# Patient Record
Sex: Male | Born: 2005 | Race: White | Hispanic: No | Marital: Single | Smoking: Never smoker
Health system: Southern US, Community
[De-identification: ages and names within clinical notes are randomized; demographics above are authoritative.]

## PROBLEM LIST (undated history)

## (undated) DIAGNOSIS — D446 Neoplasm of uncertain behavior of carotid body: Secondary | ICD-10-CM

## (undated) DIAGNOSIS — G902 Horner's syndrome: Secondary | ICD-10-CM

## (undated) DIAGNOSIS — K5900 Constipation, unspecified: Secondary | ICD-10-CM

## (undated) HISTORY — PX: ADENOIDECTOMY: SUR15

## (undated) HISTORY — PX: MYRINGOTOMY: SUR874

## (undated) HISTORY — PX: TONSILLECTOMY: SUR1361

## (undated) HISTORY — DX: Constipation, unspecified: K59.00

---
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
## 2023-02-28 ENCOUNTER — Emergency Department (HOSPITAL_COMMUNITY): Payer: BC Managed Care – PPO

## 2023-02-28 ENCOUNTER — Encounter (HOSPITAL_COMMUNITY): Payer: Self-pay | Admitting: Emergency Medicine

## 2023-02-28 ENCOUNTER — Other Ambulatory Visit: Payer: Self-pay

## 2023-02-28 ENCOUNTER — Emergency Department (HOSPITAL_COMMUNITY)
Admission: EM | Admit: 2023-02-28 | Discharge: 2023-02-28 | Disposition: A | Discharge status: Home / Self Care | Payer: BC Managed Care – PPO | Source: Home / Self Care | Attending: Emergency Medicine | Admitting: Emergency Medicine

## 2023-02-28 DIAGNOSIS — R1084 Generalized abdominal pain: Secondary | ICD-10-CM | POA: Diagnosis present

## 2023-02-28 LAB — COMPREHENSIVE METABOLIC PANEL
ALT: 17 U/L (ref 0–44)
AST: 17 U/L (ref 15–41)
Albumin: 4.3 g/dL (ref 3.5–5.0)
Alkaline Phosphatase: 47 U/L (ref 38–126)
Anion gap: 10 (ref 5–15)
BUN: 15 mg/dL (ref 6–20)
CO2: 25 mmol/L (ref 22–32)
Calcium: 9.2 mg/dL (ref 8.9–10.3)
Chloride: 104 mmol/L (ref 98–111)
Creatinine, Ser: 0.92 mg/dL (ref 0.61–1.24)
GFR, Estimated: >60 mL/min (ref >60)
Glucose, Bld: 83 mg/dL (ref 70–99)
Potassium: 3.9 mmol/L (ref 3.5–5.1)
Sodium: 139 mmol/L (ref 135–145)
Total Bilirubin: 1.4 mg/dL — ABNORMAL HIGH (ref 0.3–1.2)
Total Protein: 7 g/dL (ref 6.5–8.1)

## 2023-02-28 LAB — CBC
HCT: 43.7 % (ref 39.0–52.0)
Hemoglobin: 15.2 g/dL (ref 13.0–17.0)
MCH: 28.7 pg (ref 26.0–34.0)
MCHC: 34.8 g/dL (ref 30.0–36.0)
MCV: 82.6 fL (ref 80.0–100.0)
Platelets: 209 10*3/uL (ref 150–400)
RBC: 5.29 MIL/uL (ref 4.22–5.81)
RDW: 12.7 % (ref 11.5–15.5)
WBC: 4.1 10*3/uL (ref 4.0–10.5)
nRBC: 0 % (ref 0.0–0.2)

## 2023-02-28 LAB — LIPASE, BLOOD: Lipase: 24 U/L (ref 11–51)

## 2023-02-28 MED ORDER — IOHEXOL 300 MG/ML  SOLN
100.0000 mL | Freq: Once | INTRAMUSCULAR | Status: AC | PRN
  Administered 2023-02-28: 100 mL via INTRAVENOUS

## 2023-02-28 MED ORDER — ONDANSETRON 4 MG PO TBDP: 4.0000 mg | ORAL_TABLET | Freq: Three times a day (TID) | ORAL | 0 refills | Status: AC | PRN

## 2023-02-28 MED ORDER — NAPROXEN 500 MG PO TABS: 500.0000 mg | ORAL_TABLET | Freq: Two times a day (BID) | ORAL | 0 refills | Status: AC

## 2023-02-28 NOTE — Discharge Instructions (Signed)
You were seen today in the emergency department for abdominal pain. Labs and imaging came back within normal limits.   You were sent home with a prescription for Zofran for nausea, take as needed.  If you continue to have abdominal pain you can alternate Tylenol and ibuprofen as needed for pain control.  Return to the ED if you pain continues or worsens. Follow up with your PCP in 2-3 days.

## 2023-02-28 NOTE — ED Triage Notes (Signed)
Pt returned from [place] Monday and has been having lower abd pain worse with movement.  Pt states had diarrhea Monday and Tuesday and had not had another BM until a small amount this morning that he brought to the ER with him. Pt denies vomiting and denies fever

## 2023-02-28 NOTE — ED Provider Triage Note (Signed)
Emergency Medicine Provider Triage Evaluation Note  Chad Cummings , a 17 y.o. male  was evaluated in triage.  Pt complains of abdominal pain diarrhea.  Review of Systems  Positive: Mid epigastric and lower abdominal pain Negative: Dysuria  Physical Exam  BP 109/64 (BP Location: Right Arm)   Pulse 83   Temp 99.1 F (37.3 C) (Oral)   Resp 18   Ht 6\' 1"  (1.854 m)   Wt 80.7 kg   SpO2 100%   BMI 23.48 kg/m  Gen:   Awake, no distress   Resp:  Normal effort  MSK:   Moves extremities without difficulty  Other:  Abdomen soft, mild midepigastric and lower abdominal pain  Medical Decision Making  Medically screening exam initiated at 1:19 PM.  Appropriate orders placed.  Snehal Binks was informed that the remainder of the evaluation will be completed by another provider, this initial triage assessment does not replace that evaluation, and the importance of remaining in the ED until their evaluation is complete.     Terrilee Files, MD 02/28/23 [phone]

## 2023-02-28 NOTE — ED Provider Notes (Signed)
EMERGENCY DEPARTMENT AT [facility] Provider Note   CSN: [id number] Arrival date & time: 02/28/23  1153     History  Chief Complaint  Patient presents with   Abdominal Pain    Chad Cummings is a 17 y.o. male abd pain and change in BM. Pt returned from [place] on Monday. Reports having diarrhea Mon and Tue, then did not have a BM until today. He started having abdominal pain Thursday that was episodic and random. Reports pain is not worse with movement. Pain is generalized to lower abdomen, cramping and sharp pain lasting several minutes. Pain is 10/10 when he is having pain, but right now pt is reporting 0/10. Pt tried taking milk of mg and another laxative yesterday, but did not have a BM. Pt was seen by PCP this morning who was concerned about appendicitis and sent him here to be seen.    Abdominal Pain Associated symptoms: constipation   Associated symptoms: no chest pain, no diarrhea, no dysuria, no fatigue, no fever, no hematuria, no nausea and no shortness of breath        Home Medications Prior to Admission medications   Medication Sig Start Date End Date Taking? Authorizing Provider  famotidine (PEPCID) 40 MG tablet Take 40 mg by mouth daily. 07/04/20   [provider]  fluticasone (FLONASE) 50 MCG/ACT nasal spray Place 2 sprays into the nose daily as needed. For allergies    [provider]  levocetirizine (XYZAL) 5 MG tablet SMARTSIG:1 Tablet(s) By Mouth Every Evening 06/13/20   [provider]  montelukast (SINGULAIR) 5 MG chewable tablet Chew 5 mg by mouth at bedtime.    [provider]  sertraline (ZOLOFT) 50 MG tablet Take 1 tablet (50 mg total) by mouth daily. 05/14/22   Paretta-Leahey, Miachel Roux, NP  triamcinolone ointment (KENALOG) 0.1 %  07/07/16   [provider]      Allergies    Polymyxin b and Amoxicillin    Review of Systems   Review of Systems  Constitutional:  Negative for fatigue and fever.   Respiratory:  Negative for chest tightness, shortness of breath and wheezing.   Cardiovascular:  Negative for chest pain and leg swelling.  Gastrointestinal:  Positive for abdominal pain and constipation. Negative for abdominal distention, blood in stool, diarrhea and nausea.  Genitourinary:  Negative for dysuria, frequency and hematuria.  Neurological:  Negative for dizziness, syncope and weakness.    Physical Exam Updated Vital Signs BP 109/64 (BP Location: Right Arm)   Pulse 83   Temp 99.1 F (37.3 C) (Oral)   Resp 18   Ht 6\' 1"  (1.854 m)   Wt 80.7 kg   SpO2 100%   BMI 23.48 kg/m  Physical Exam Vitals and nursing note reviewed.  Constitutional:      General: He is not in acute distress.    Appearance: He is not toxic-appearing.  Cardiovascular:     Rate and Rhythm: Normal rate and regular rhythm.     Pulses: Normal pulses.     Heart sounds: Normal heart sounds.  Pulmonary:     Effort: Pulmonary effort is normal. No respiratory distress.     Breath sounds: Normal breath sounds.  Abdominal:     General: Abdomen is flat. Bowel sounds are normal. There is no distension.     Palpations: Abdomen is soft.     Tenderness: There is no abdominal tenderness.  Skin:    General: Skin is warm and dry.  Findings: No lesion.  Neurological:     General: No focal deficit present.     Mental Status: He is alert and oriented to person, place, and time. Mental status is at baseline.     ED Results / Procedures / Treatments   Labs (all labs ordered are listed, but only abnormal results are displayed) Labs Reviewed  COMPREHENSIVE METABOLIC PANEL - Abnormal; Notable for the following components:      Result Value   Total Bilirubin 1.4 (*)    All other components within normal limits  LIPASE, BLOOD  CBC  URINALYSIS, ROUTINE W REFLEX MICROSCOPIC    EKG None  Radiology CT ABDOMEN PELVIS W CONTRAST  Result Date: 02/28/2023 CLINICAL DATA:  RIGHT lower quadrant pain. EXAM: CT  ABDOMEN AND PELVIS WITH CONTRAST TECHNIQUE: Multidetector CT imaging of the abdomen and pelvis was performed using the standard protocol following bolus administration of intravenous contrast. RADIATION DOSE REDUCTION: This exam was performed according to the departmental dose-optimization program which includes automated exposure control, adjustment of the mA and/or kV according to patient size and/or use of iterative reconstruction technique. CONTRAST:  OMNIPAQUE IOHEXOL 300 MG/ML  SOLN COMPARISON:  None Available. FINDINGS: Lower chest: Lung bases are clear. Hepatobiliary: No focal hepatic lesion. Normal gallbladder. No biliary duct dilatation. Common bile duct is normal. Pancreas: Pancreas is normal. No ductal dilatation. No pancreatic inflammation. Spleen: Normal spleen Adrenals/urinary tract: Adrenal glands and kidneys are normal. The ureters and bladder normal. Stomach/Bowel: Stomach, small bowel, appendix, and cecum are normal. The colon and rectosigmoid colon are normal. Vascular/Lymphatic: Abdominal aorta is normal caliber. No periportal or retroperitoneal adenopathy. No pelvic adenopathy. Reproductive: Prostate normal Other: No free fluid. Musculoskeletal: No aggressive osseous lesion. IMPRESSION: 1. Normal appendix. 2. No bowel inflammation or obstruction. 3. No renal stone disease. Electronically Signed   By: Genevive Bi M.D.   On: 02/28/2023 15:24    Procedures Procedures    Medications Ordered in ED Medications  iohexol (OMNIPAQUE) 300 MG/ML solution 100 mL (100 mLs Intravenous Contrast Given 02/28/23 1459)    ED Course/ Medical Decision Making/ A&P                                 Medical Decision Making Amount and/or Complexity of Data Reviewed Labs: ordered.  Risk Prescription drug management.   This patient presents to the ED for concern of abdominal pain after a period of diarrhea followed by constipation, this involves an extensive number of treatment options, and  is a complaint that carries with it a high risk of complications and morbidity.  The differential diagnosis includes appendicitis, gastroenteritis, diarrhea, constipation    Co morbidities that complicate the patient evaluation  Pt reports no prior medical history    Additional history obtained:  Additional history obtained from mother at bedside w/ pt    Lab Tests:  I Ordered, and personally interpreted labs.  The pertinent results include:   Cbc, cmp and lipase unremarkable    Imaging Studies ordered:  I ordered imaging studies including CT abd/pelvis w/ contrast   I independently visualized and interpreted imaging which showed unremarkable, no acute appendicitis was visualized  I agree with the radiologist interpretation   Cardiac Monitoring: / EKG:  Not obtained    Consultations Obtained:  Consult was not obtained for pt    Problem List / ED Course / Critical interventions / Medication management  This pt is reporting with  abd pain and change in BM. His vitals were stable throughout visit today. Reports primary concern abdominal pain.   Pain is generalized to lower abdomen, cramping and sharp pain lasting several minutes.Pt was seen by PCP this morning who was concerned about appendicitis and sent him here to be seen.  I ordered medication for management of nausea  Reevaluation of the patient after these medicines showed that the patient stayed the same I have reviewed the patients home medicines and have made adjustments as needed     Plan is to send patient home with Zofran for nausea and OTC pain control for abdominal pain. Appendicitis was ruled out w/ CT abd/pelvis. Pt encouraged to discontinue laxities and let diarrhea and constipation resolve on its own. If symptoms continued or worsen encouraged to return to ED.        Final Clinical Impression(s) / ED Diagnoses Final diagnoses:  None    Rx / DC Orders ED Discharge Orders     None          Raford Pitcher Evalee Jefferson 02/28/23 1856    Eber Hong, MD 03/01/23 [phone]

## 2024-07-28 ENCOUNTER — Emergency Department (HOSPITAL_COMMUNITY): Admission: EM | Admit: 2024-07-28 | Discharge: 2024-07-29 | Disposition: A | Discharge status: Home / Self Care

## 2024-07-28 ENCOUNTER — Encounter (HOSPITAL_COMMUNITY): Payer: Self-pay

## 2024-07-28 ENCOUNTER — Other Ambulatory Visit: Payer: Self-pay

## 2024-07-28 DIAGNOSIS — K529 Noninfective gastroenteritis and colitis, unspecified: Secondary | ICD-10-CM | POA: Insufficient documentation

## 2024-07-28 DIAGNOSIS — E860 Dehydration: Secondary | ICD-10-CM | POA: Diagnosis not present

## 2024-07-28 DIAGNOSIS — D72829 Elevated white blood cell count, unspecified: Secondary | ICD-10-CM | POA: Diagnosis not present

## 2024-07-28 DIAGNOSIS — R197 Diarrhea, unspecified: Secondary | ICD-10-CM | POA: Diagnosis present

## 2024-07-28 LAB — CBC
HCT: 47 % (ref 39.0–52.0)
Hemoglobin: 17 g/dL (ref 13.0–17.0)
MCH: 29.2 pg (ref 26.0–34.0)
MCHC: 36.2 g/dL — ABNORMAL HIGH (ref 30.0–36.0)
MCV: 80.6 fL (ref 80.0–100.0)
Platelets: 233 K/uL (ref 150–400)
RBC: 5.83 MIL/uL — ABNORMAL HIGH (ref 4.22–5.81)
RDW: 12.5 % (ref 11.5–15.5)
WBC: 11.5 K/uL — ABNORMAL HIGH (ref 4.0–10.5)
nRBC: 0 % (ref 0.0–0.2)

## 2024-07-28 LAB — RESP PANEL BY RT-PCR (RSV, FLU A&B, COVID)  RVPGX2
Influenza A by PCR: NEGATIVE
Influenza B by PCR: NEGATIVE
Resp Syncytial Virus by PCR: NEGATIVE
SARS Coronavirus 2 by RT PCR: NEGATIVE

## 2024-07-28 LAB — COMPREHENSIVE METABOLIC PANEL WITH GFR
ALT: 14 U/L (ref 0–44)
AST: 19 U/L (ref 15–41)
Albumin: 5.2 g/dL — ABNORMAL HIGH (ref 3.5–5.0)
Alkaline Phosphatase: 52 U/L (ref 38–126)
Anion gap: 14 (ref 5–15)
BUN: 17 mg/dL (ref 6–20)
CO2: 25 mmol/L (ref 22–32)
Calcium: 9.9 mg/dL (ref 8.9–10.3)
Chloride: 101 mmol/L (ref 98–111)
Creatinine, Ser: 0.87 mg/dL (ref 0.61–1.24)
GFR, Estimated: >60 mL/min
Glucose, Bld: 125 mg/dL — ABNORMAL HIGH (ref 70–99)
Potassium: 4.1 mmol/L (ref 3.5–5.1)
Sodium: 140 mmol/L (ref 135–145)
Total Bilirubin: 1.6 mg/dL — ABNORMAL HIGH (ref 0.0–1.2)
Total Protein: 7.4 g/dL (ref 6.5–8.1)

## 2024-07-28 LAB — SARS CORONAVIRUS 2 BY RT PCR: SARS Coronavirus 2 by RT PCR: NEGATIVE

## 2024-07-28 LAB — LIPASE, BLOOD: Lipase: 20 U/L (ref 11–51)

## 2024-07-28 NOTE — ED Triage Notes (Signed)
 Pt reports headache, vomiting, diarrhea since this morning.

## 2024-07-29 LAB — URINALYSIS, ROUTINE W REFLEX MICROSCOPIC
Bacteria, UA: NONE SEEN
Bilirubin Urine: NEGATIVE
Glucose, UA: NEGATIVE mg/dL
Hgb urine dipstick: NEGATIVE
Ketones, ur: 20 mg/dL — AB
Leukocytes,Ua: NEGATIVE
Nitrite: NEGATIVE
Protein, ur: 30 mg/dL — AB
Specific Gravity, Urine: 1.038 — ABNORMAL HIGH (ref 1.005–1.030)
pH: 5 (ref 5.0–8.0)

## 2024-07-29 MED ORDER — ONDANSETRON HCL 4 MG PO TABS: 4.0000 mg | ORAL_TABLET | Freq: Three times a day (TID) | ORAL | 0 refills | Status: AC | PRN

## 2024-07-29 MED ORDER — LACTATED RINGERS IV BOLUS
1000.0000 mL | Freq: Once | INTRAVENOUS | Status: AC
  Administered 2024-07-29: 1000 mL via INTRAVENOUS

## 2024-07-29 MED ORDER — ONDANSETRON HCL 4 MG/2ML IJ SOLN
4.0000 mg | Freq: Once | INTRAMUSCULAR | Status: AC
  Administered 2024-07-29: 4 mg via INTRAVENOUS
  Filled 2024-07-29: qty 2

## 2024-07-29 MED ORDER — KETOROLAC TROMETHAMINE 15 MG/ML IJ SOLN
15.0000 mg | Freq: Once | INTRAMUSCULAR | Status: AC
  Administered 2024-07-29: 15 mg via INTRAVENOUS
  Filled 2024-07-29: qty 1

## 2024-07-29 NOTE — ED Provider Notes (Signed)
 " [place] EMERGENCY DEPARTMENT AT [facility] Provider Note   CSN: [id number] Arrival date & time: 07/28/24  1929     Patient presents with: Emesis, Diarrhea, and Headache   Chad Cummings is a 19 y.o. male.  {Add pertinent medical, surgical, social history, OB history to HPI:2124} 19 year old male presents for evaluation of diarrhea, vomiting and headache that started earlier today.  States has been unable to keep anything down.  States he has not vomited in 3 hours.  He denies any other symptoms or concerns.   Emesis Associated symptoms: diarrhea and headaches   Associated symptoms: no abdominal pain, no arthralgias, no chills, no cough, no fever and no sore throat   Diarrhea Associated symptoms: headaches and vomiting   Associated symptoms: no abdominal pain, no arthralgias, no chills and no fever   Headache Associated symptoms: diarrhea, nausea and vomiting   Associated symptoms: no abdominal pain, no back pain, no cough, no ear pain, no eye pain, no fever, no seizures and no sore throat        Prior to Admission medications  Medication Sig Start Date End Date Taking? Authorizing Provider  famotidine (PEPCID) 40 MG tablet Take 40 mg by mouth daily. 07/04/20   [provider]  fluticasone (FLONASE) 50 MCG/ACT nasal spray Place 2 sprays into the nose daily as needed. For allergies    [provider]  levocetirizine (XYZAL) 5 MG tablet SMARTSIG:1 Tablet(s) By Mouth Every Evening 06/13/20   [provider]  montelukast (SINGULAIR) 5 MG chewable tablet Chew 5 mg by mouth at bedtime.    [provider]  naproxen  (NAPROSYN ) 500 MG tablet Take 1 tablet (500 mg total) by mouth 2 (two) times daily with a meal. 02/28/23   Cleotilde Rogue, MD  ondansetron  (ZOFRAN -ODT) 4 MG disintegrating tablet Take 1 tablet (4 mg total) by mouth every 8 (eight) hours as needed for nausea. 02/28/23   Cleotilde Rogue, MD  sertraline  (ZOLOFT ) 50 MG tablet Take 1  tablet (50 mg total) by mouth daily. 05/14/22   Paretta-Leahey, Stephane HERO, NP  triamcinolone ointment (KENALOG) 0.1 %  07/07/16   [provider]    Allergies: Polymyxin b and Amoxicillin    Review of Systems  Constitutional:  Negative for chills and fever.  HENT:  Negative for ear pain and sore throat.   Eyes:  Negative for pain and visual disturbance.  Respiratory:  Negative for cough and shortness of breath.   Cardiovascular:  Negative for chest pain and palpitations.  Gastrointestinal:  Positive for diarrhea, nausea and vomiting. Negative for abdominal pain.  Genitourinary:  Negative for dysuria and hematuria.  Musculoskeletal:  Negative for arthralgias and back pain.  Skin:  Negative for color change and rash.  Neurological:  Positive for headaches. Negative for seizures and syncope.  All other systems reviewed and are negative.   Updated Vital Signs BP 114/73   Pulse 98   Temp 99.5 F (37.5 C) (Oral)   Resp 17   Ht 6' 1 (1.854 m)   Wt 90.7 kg   SpO2 99%   BMI 26.39 kg/m   Physical Exam Vitals and nursing note reviewed.  Constitutional:      General: He is not in acute distress.    Appearance: He is well-developed. He is ill-appearing.  HENT:     Head: Normocephalic and atraumatic.  Eyes:     Conjunctiva/sclera: Conjunctivae normal.  Cardiovascular:     Rate and Rhythm: Regular rhythm. Tachycardia present.  Heart sounds: No murmur heard. Pulmonary:     Effort: Pulmonary effort is normal. No respiratory distress.     Breath sounds: Normal breath sounds.  Abdominal:     Palpations: Abdomen is soft.     Tenderness: There is no abdominal tenderness.  Musculoskeletal:        General: No swelling.     Cervical back: Neck supple.  Skin:    General: Skin is warm and dry.     Capillary Refill: Capillary refill takes less than 2 seconds.  Neurological:     Mental Status: He is alert.  Psychiatric:        Mood and Affect: Mood normal.     (all labs  ordered are listed, but only abnormal results are displayed) Labs Reviewed  COMPREHENSIVE METABOLIC PANEL WITH GFR - Abnormal; Notable for the following components:      Result Value   Glucose, Bld 125 (*)    Albumin 5.2 (*)    Total Bilirubin 1.6 (*)    All other components within normal limits  CBC - Abnormal; Notable for the following components:   WBC 11.5 (*)    RBC 5.83 (*)    MCHC 36.2 (*)    All other components within normal limits  SARS CORONAVIRUS 2 BY RT PCR  RESP PANEL BY RT-PCR (RSV, FLU A&B, COVID)  [id number]  LIPASE, BLOOD  URINALYSIS, ROUTINE W REFLEX MICROSCOPIC    EKG: None  Radiology: No results found.  {Document cardiac monitor, telemetry assessment procedure when appropriate:32947} Procedures   Medications Ordered in the ED  lactated ringers  bolus 1,000 mL (1,000 mLs Intravenous New Bag/Given 07/29/24 0102)  ketorolac  (TORADOL ) 15 MG/ML injection 15 mg (15 mg Intravenous Given 07/29/24 0105)  ondansetron  (ZOFRAN ) injection 4 mg (4 mg Intravenous Given 07/29/24 0104)      {Click here for ABCD2, HEART and other calculators REFRESH Note before signing:1}                              Medical Decision Making Amount and/or Complexity of Data Reviewed Labs: ordered.  Risk Prescription drug management.   ***  {Document critical care time when appropriate  Document review of labs and clinical decision tools ie CHADS2VASC2, etc  Document your independent review of radiology images and any outside records  Document your discussion with family members, caretakers and with consultants  Document social determinants of health affecting pt's care  Document your decision making why or why not admission, treatments were needed:32947:::1}   Final diagnoses:  None    ED Discharge Orders     None        "

## 2024-07-29 NOTE — ED Notes (Signed)
 Blood labs drawn: blue, green x2, purple.

## 2024-07-29 NOTE — Discharge Instructions (Signed)
 Your symptoms should start to improve after 24 to 48 hours.  Use Zofran  as needed for nausea and vomiting.  You can pick up Imodium, it is over-the-counter and take it as needed for diarrhea.  You can alternate Tylenol  and Motrin  as needed for pain and cramping.  Keep a bland/BRAT diet for the next 24 hours and increase your fluid intake.
